# Patient Record
Sex: Female | Born: 1970 | Hispanic: Yes | Marital: Single
Health system: Southern US, Community
[De-identification: ages and names within clinical notes are randomized; demographics above are authoritative.]

## PROBLEM LIST (undated history)

## (undated) HISTORY — PX: BREAST CYST ASPIRATION: SHX578

---

## 2012-01-10 ENCOUNTER — Emergency Department: Payer: Self-pay | Admitting: Emergency Medicine

## 2012-01-10 LAB — CBC WITH DIFFERENTIAL/PLATELET
Basophil %: 1.5 %
Eosinophil #: 0.2 10*3/uL (ref 0.0–0.7)
Eosinophil %: 2.9 %
HCT: 37 % (ref 35.0–47.0)
HGB: 12.7 g/dL (ref 12.0–16.0)
Lymphocyte #: 2.9 10*3/uL (ref 1.0–3.6)
Lymphocyte %: 40 %
MCH: 30.9 pg (ref 26.0–34.0)
Monocyte #: 0.7 x10 3/mm (ref 0.2–0.9)
Neutrophil #: 3.3 10*3/uL (ref 1.4–6.5)
Neutrophil %: 45.2 %
RBC: 4.1 10*6/uL (ref 3.80–5.20)

## 2012-01-10 LAB — COMPREHENSIVE METABOLIC PANEL
Albumin: 3.9 g/dL (ref 3.4–5.0)
Alkaline Phosphatase: 62 U/L (ref 50–136)
Anion Gap: 7 (ref 7–16)
Bilirubin,Total: 0.3 mg/dL (ref 0.2–1.0)
Calcium, Total: 8.1 mg/dL — ABNORMAL LOW (ref 8.5–10.1)
Creatinine: 0.73 mg/dL (ref 0.60–1.30)
Osmolality: 276 (ref 275–301)
Potassium: 3.6 mmol/L (ref 3.5–5.1)
Sodium: 138 mmol/L (ref 136–145)
Total Protein: 7.3 g/dL (ref 6.4–8.2)

## 2012-01-10 LAB — CK TOTAL AND CKMB (NOT AT ARMC)
CK, Total: 88 U/L (ref 21–215)
CK-MB: 0.7 ng/mL (ref 0.5–3.6)

## 2012-01-10 LAB — TROPONIN I: Troponin-I: <0.02 ng/mL

## 2012-01-10 LAB — PREGNANCY, URINE: Pregnancy Test, Urine: NEGATIVE m[IU]/mL

## 2018-01-30 ENCOUNTER — Encounter: Payer: Self-pay | Admitting: *Deleted

## 2018-01-30 ENCOUNTER — Ambulatory Visit: Payer: Self-pay | Attending: Oncology | Admitting: *Deleted

## 2018-01-30 ENCOUNTER — Encounter (INDEPENDENT_AMBULATORY_CARE_PROVIDER_SITE_OTHER): Payer: Self-pay

## 2018-01-30 VITALS — BP 104/71 | HR 67 | Temp 98.1°F | Ht 62.75 in | Wt 141.0 lb

## 2018-01-30 DIAGNOSIS — N644 Mastodynia: Secondary | ICD-10-CM

## 2018-01-30 NOTE — Patient Instructions (Signed)
Prueba del VPH HPV Test La prueba del virus del papiloma humano (VPH) se usa para detectar los tipos de infeccin por el VPH de alto riesgo. El VPH es un grupo de alrededor de 100 virus. Muchos de estos virus causan tumores dentro de los genitales, sobre ellos o a su alrededor. La mayora de los VPH provocan infecciones que suelen desaparecen sin tratamiento. Sin embargo, los tipos 6, 11, 16 y 18 del VPH se consideran de alto riesgo y pueden aumentar el riesgo de padecer cncer de cuello del tero o de ano si la infeccin no se trata. La prueba del VPH identifica las cadenas de ADN (genticas) de la infeccin por el VPH, por lo que tambin se denomina prueba de ADN para el VPH. Aunque el VPH se encuentra tanto en los hombres como en las mujeres, la prueba del VPH se usa solo para detectar un mayor riesgo de cncer en las mujeres:  Con una prueba de Papanicolaou anormal.  Despus del tratamiento de una prueba de Papanicolaou anormal.  Entre 30y 65aos.  Despus del tratamiento de una infeccin por el VPH de alto riesgo.  La prueba del VPH se puede hacer al mismo tiempo que un examen plvico y una prueba de Papanicolaou en mujeres de ms de 30 aos. Tanto la prueba del VPH como la prueba de Papanicolaou requieren una muestra de clulas del cuello del tero. Cmo debo prepararme para esta prueba?  No se haga lavados vaginales ni se d un bao durante 24 a 48 horas antes de la prueba o como se lo haya indicado el mdico.  No tenga sexo durante 24 a 48 horas antes de la prueba o como se lo haya indicado el mdico.  Es posible que se le pida que reprograme la prueba si est menstruando.  Se le pedir que orine antes de la prueba. Qu significan los resultados? Es su responsabilidad retirar el resultado del estudio. Consulte en el laboratorio o en el departamento en el que fue realizado el estudio cundo y cmo podr obtener los resultados. Hable con el mdico si tiene alguna pregunta sobre los  resultados. El resultado ser negativo o positivo. Significado de los resultados negativos del anlisis Un resultado negativo de la prueba del VPH significa que no se detect el VPH, y es muy probable que no tenga el virus. Significado de los resultados positivos del anlisis Un resultado positivo de la prueba del VPH indica que tiene el virus.  Si el resultado de la prueba muestra la presencia de alguna cadena del VPH de alto riesgo, puede tener mayor riesgo de padecer cncer de cuello del tero o de ano si la infeccin no se trata.  Si se encuentran cadenas del VPH de bajo riesgo, es poco probable que tenga un alto riesgo de padecer cncer.  Hable con el mdico sobre los resultados. El mdico utilizar los resultados para realizar un diagnstico y determinar un plan de tratamiento adecuado para usted. Hable con el mdico sobre los resultados, las opciones de tratamiento y, si es necesario, la necesidad de realizar ms estudios. Hable con el mdico si tiene alguna pregunta sobre los resultados. Esta informacin no tiene como fin reemplazar el consejo del mdico. Asegrese de hacerle al mdico cualquier pregunta que tenga. Document Released: 05/20/2008 Document Revised: 04/22/2016 Document Reviewed: 06/19/2013 Elsevier Interactive Patient Education  2018 [employer]  Gave patient hand-out, Women Staying Healthy, Active and Well from [facility], with education on breast health, pap smears, heart and colon health.  

## 2018-01-30 NOTE — Progress Notes (Signed)
  Subjective:     Patient ID: Alexandria Gonzalez, female   DOB: 07/25/1970, 47 y.o.   MRN: [id number]  HPI   Review of Systems     Objective:   Physical Exam Chest:     Breasts:        Right: No swelling, bleeding, inverted nipple, mass, nipple discharge, skin change or tenderness.        Left: Tenderness present. No bleeding, inverted nipple, mass, nipple discharge or skin change.    Abdominal:     Palpations: There is no hepatomegaly or splenomegaly.  Genitourinary:    Labia:        Right: No rash, tenderness, lesion or injury.        Left: No rash, tenderness, lesion or injury.      Cervix: No cervical motion tenderness, discharge, friability, lesion or erythema.     Uterus: Not deviated, not enlarged, not fixed and not tender.      Adnexa:        Right: No mass, tenderness or fullness.         Left: No mass, tenderness or fullness.             Assessment:     47 year old Hispanic [facility] pap and [facility]ogram.  Alexandria Gonzalez # [phone] used for interpretation during the exam.  Patient complains of left breast pain for the past 2 months.  States it's intermittent.  States wearing an underwire bra makes it worse.  There is no dominant mass, skin changes or nipple discharge.  The patient is very tender to palpation at 3:00 left breast 3 cm from the nipple.  Taught self breast awareness.  Specimen collected for pap smear.  Patient refused rectal exam today.  Patient has been screened for eligibility.  She does not have any insurance, Medicare or Medicaid.  She also meets financial eligibility.  Hand-out given on the Affordable Care Act. Risk Assessment    Risk Scores      01/30/2018   Last edited by: Alexandria Gonzalez   5-year risk: 0.7 %   Lifetime risk: 6.5 %            Plan:     Will order diagnostic mammogram and ultrasound for the targeted left breast pain.  Alexandria Gonzalez to schedule patient's appointment.  Specimen for pap  smear sent to the lab.

## 2018-02-01 ENCOUNTER — Other Ambulatory Visit: Payer: Self-pay | Admitting: *Deleted

## 2018-02-01 DIAGNOSIS — Z0000 Encounter for general adult medical examination without abnormal findings: Secondary | ICD-10-CM

## 2018-02-03 ENCOUNTER — Encounter: Payer: Self-pay | Admitting: Radiology

## 2018-02-03 ENCOUNTER — Ambulatory Visit
Admission: RE | Admit: 2018-02-03 | Discharge: 2018-02-03 | Disposition: A | Discharge status: Home / Self Care | Payer: Self-pay | Source: Ambulatory Visit | Attending: Oncology | Admitting: Oncology

## 2018-02-03 DIAGNOSIS — N644 Mastodynia: Secondary | ICD-10-CM | POA: Insufficient documentation

## 2018-02-14 LAB — PAP LB AND HPV HIGH-RISK: HPV, high-risk: POSITIVE — AB

## 2018-03-16 ENCOUNTER — Encounter: Payer: Self-pay | Admitting: *Deleted

## 2018-03-16 NOTE — Progress Notes (Signed)
Letter mailed to inform patient of her normal mammogram and pap smear.  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  Next mammogram and pap due in one year.  Appointment scheduled for 01/31/19 @ 10:00.  [facility] to [place].

## 2018-10-05 ENCOUNTER — Other Ambulatory Visit: Payer: Self-pay

## 2018-10-05 DIAGNOSIS — Z20822 Contact with and (suspected) exposure to covid-19: Secondary | ICD-10-CM

## 2018-10-06 LAB — NOVEL CORONAVIRUS, NAA: SARS-CoV-2, NAA: NOT DETECTED

## 2019-01-30 ENCOUNTER — Encounter: Payer: Self-pay | Admitting: *Deleted

## 2019-01-30 NOTE — Progress Notes (Signed)
Tried to call patient via Alexandria Gonzalez the interpreter, to prescreen for her [place] appointment.  Left message for her to return my call.

## 2019-01-31 ENCOUNTER — Other Ambulatory Visit: Payer: Self-pay

## 2019-01-31 ENCOUNTER — Ambulatory Visit
Admission: RE | Admit: 2019-01-31 | Discharge: 2019-01-31 | Disposition: A | Discharge status: Home / Self Care | Payer: Self-pay | Source: Ambulatory Visit | Attending: Oncology | Admitting: Oncology

## 2019-01-31 ENCOUNTER — Ambulatory Visit: Payer: Self-pay | Attending: Oncology | Admitting: *Deleted

## 2019-01-31 ENCOUNTER — Encounter: Payer: Self-pay | Admitting: *Deleted

## 2019-01-31 DIAGNOSIS — Z0000 Encounter for general adult medical examination without abnormal findings: Secondary | ICD-10-CM

## 2019-01-31 NOTE — Progress Notes (Signed)
  Subjective:     Patient ID: Alexandria Gonzalez, female   DOB: 03-Jul-1970, 48 y.o.   MRN: [id number]  HPI   Review of Systems     Objective:   Physical Exam     Assessment:     48 year old Hispanic female presented directly to the [facility] today for her screening mammogram. See Al Pimple, RN notes.      Plan:     Will follow up per [facility] protocol.  Will schedule patient's pap for a later date due to Covid 19 pandemic.

## 2019-01-31 NOTE — Progress Notes (Signed)
Prescreened patient for [facility] eligibility using two patient identifiers due to COVID 19 precautions.  Patient to be rescheduled for 1 year follow-up pap. Her pap results from 01/30/18 were negative/HPV positive. Patient to arrive at [facility] at 10:00 for annual screening mammogram.  Risk Assessment    Risk Scores      01/31/2019 01/30/2018   Last edited by: Theodore Demark, RN [place], Laddie Aquas, CMA   5-year risk: 0.7 % 0.7 %   Lifetime risk: 6.4 % 6.5 %

## 2019-02-12 ENCOUNTER — Encounter: Payer: Self-pay | Admitting: *Deleted

## 2019-02-12 NOTE — Progress Notes (Signed)
Letter mailed from the [facility] to inform patient of her normal mammogram results.  Patient is to follow-up with annual screening in one year. 

## 2019-04-09 ENCOUNTER — Ambulatory Visit: Payer: Self-pay | Attending: Internal Medicine

## 2019-04-09 DIAGNOSIS — Z23 Encounter for immunization: Secondary | ICD-10-CM | POA: Insufficient documentation

## 2019-04-09 NOTE — Progress Notes (Signed)
   Covid-19 Vaccination Clinic  Name:  Alexandria Gonzalez    MRN: [id number] DOB: 07/14/1970  04/09/2019  Alexandria Gonzalez was observed post Covid-19 immunization for 15 minutes without incidence. She was provided with Vaccine Information Sheet and instruction to access the V-Safe system.   Alexandria Gonzalez was instructed to call 911 with any severe reactions post vaccine: Marland Kitchen Difficulty breathing  . Swelling of your face and throat  . A fast heartbeat  . A bad rash all over your body  . Dizziness and weakness    Immunizations Administered    Name Date Dose VIS Date Route   Moderna COVID-19 Vaccine 04/09/2019 10:00 AM 0.5 mL 01/16/2019 Intramuscular   Manufacturer: Moderna   Lot: [id number]   NDC: 70017-494-49

## 2019-05-08 ENCOUNTER — Ambulatory Visit: Payer: Self-pay | Attending: Internal Medicine

## 2019-05-08 DIAGNOSIS — Z23 Encounter for immunization: Secondary | ICD-10-CM

## 2019-05-08 NOTE — Progress Notes (Signed)
   Covid-19 Vaccination Clinic  Name:  Courtne Lighty    MRN: [id number] DOB: 01/24/1971  05/08/2019  Ms. Montalvo Sondra Come was observed post Covid-19 immunization for 30 minutes based on pre-vaccination screening without incident. She was provided with Vaccine Information Sheet and instruction to access the V-Safe system.   Ms. Ayslin Kundert was instructed to call 911 with any severe reactions post vaccine: Marland Kitchen Difficulty breathing  . Swelling of face and throat  . A fast heartbeat  . A bad rash all over body  . Dizziness and weakness   Immunizations Administered    Name Date Dose VIS Date Route   Moderna COVID-19 Vaccine 05/08/2019  9:51 AM 0.5 mL 01/16/2019 Intramuscular   Manufacturer: Gala Murdoch   Lot: [id number]   NDC: 52841-324-40

## 2019-12-31 ENCOUNTER — Ambulatory Visit: Payer: Self-pay | Attending: Internal Medicine

## 2019-12-31 DIAGNOSIS — Z23 Encounter for immunization: Secondary | ICD-10-CM

## 2019-12-31 NOTE — Progress Notes (Signed)
   Covid-19 Vaccination Clinic  Name:  Alexandria Gonzalez    MRN: [id number] DOB: 05-07-1970  12/31/2019  Alexandria Gonzalez was observed post Covid-19 immunization for 15 minutes without incident. She was provided with Vaccine Information Sheet and instruction to access the V-Safe system.   Alexandria Gonzalez was instructed to call 911 with any severe reactions post vaccine: Marland Kitchen Difficulty breathing  . Swelling of face and throat  . A fast heartbeat  . A bad rash all over body  . Dizziness and weakness   Immunizations Administered    Name Date Dose VIS Date Route   Moderna COVID-19 Vaccine 12/31/2019  1:50 PM 0.5 mL 12/05/2019 Intramuscular   Manufacturer: Moderna   Lot: [id number]   NDC: 18299-371-69

## 2021-12-07 IMAGING — MG DIGITAL SCREENING BILAT W/ TOMO W/ CAD
8 series · 9 of 24 positions shown · non-contrast
Comparison: Previous exam(s).

CLINICAL DATA: Screening.

EXAM:
DIGITAL SCREENING BILATERAL MAMMOGRAM WITH TOMO AND CAD

[L CC synth-2D]
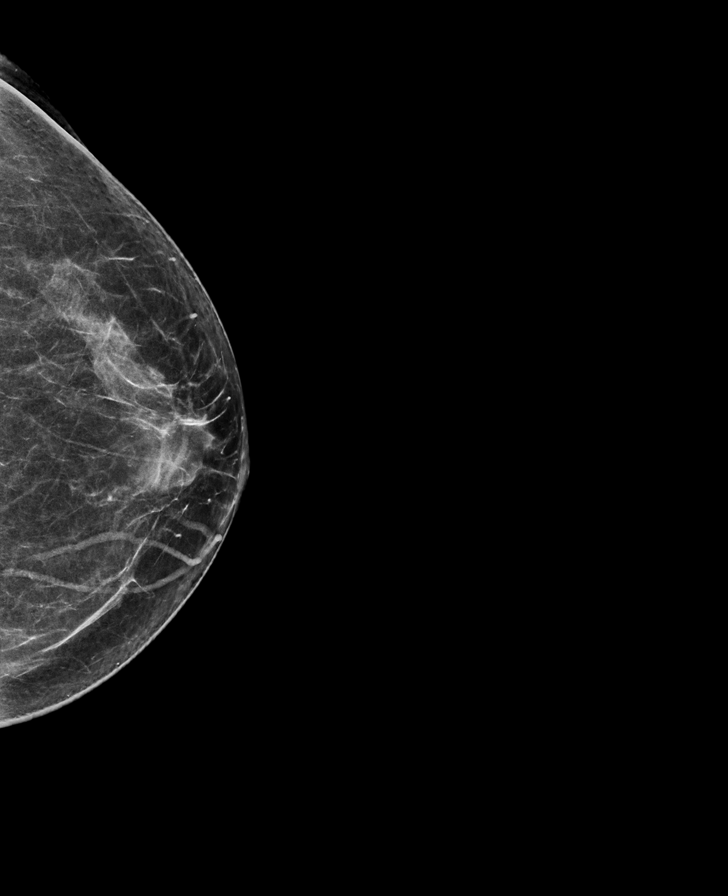

[L MLO synth-2D]
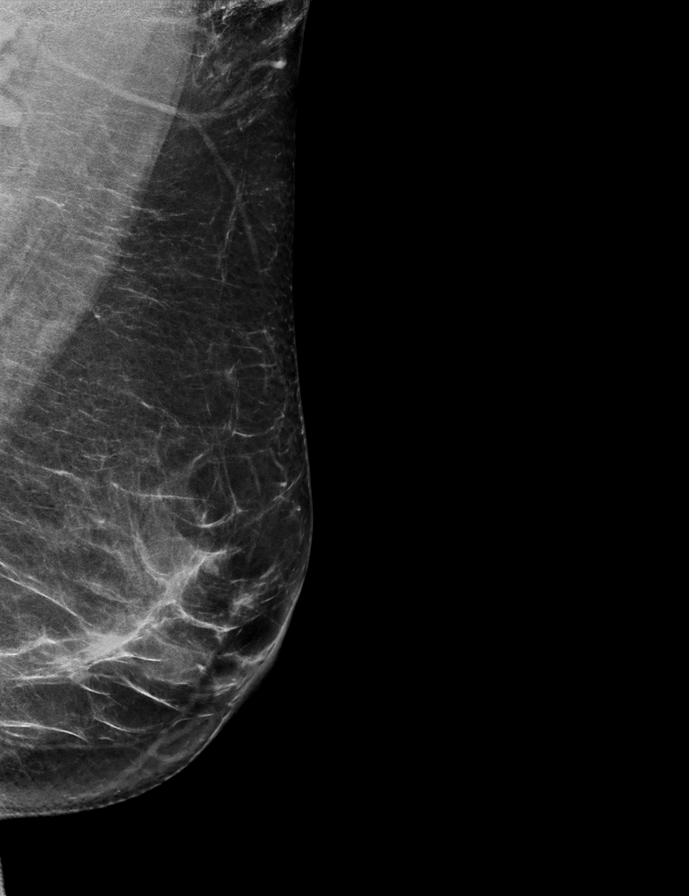

[R MLO synth-2D]
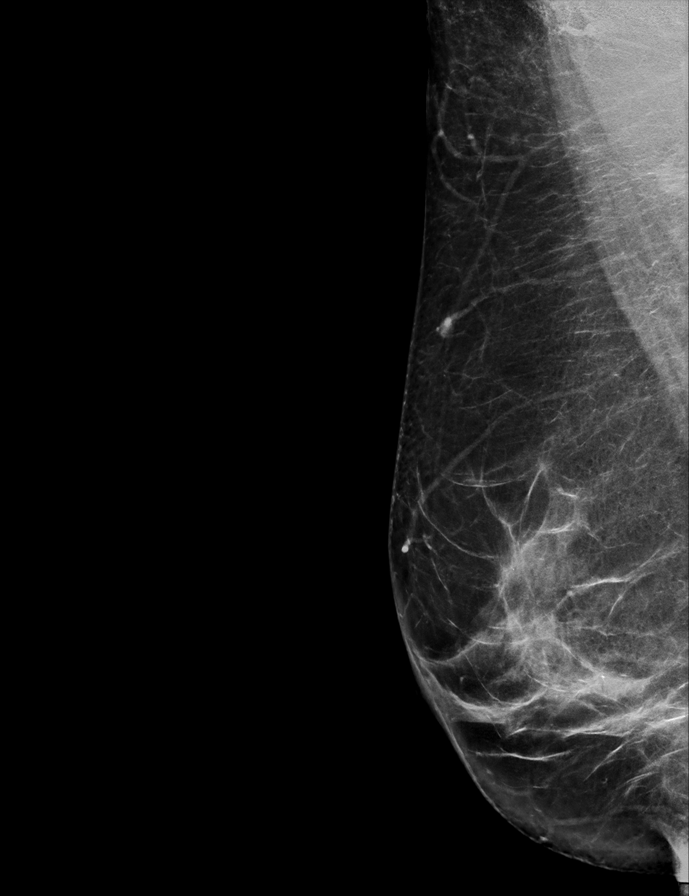

[R CC synth-2D]
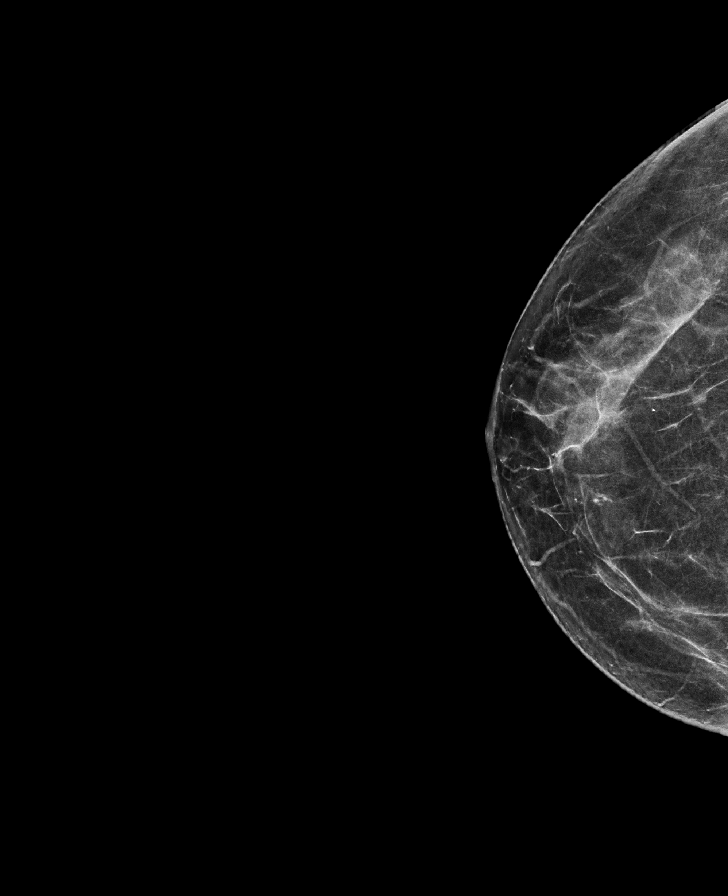

[R MLO tomo · 2 of 76 frames shown]
[frame 25/76]
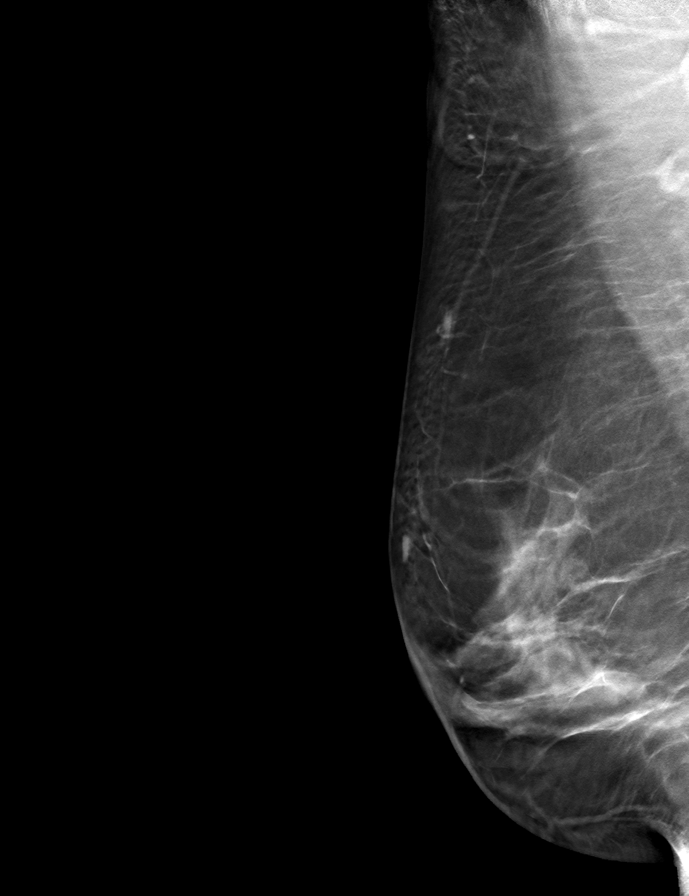
[frame 39/76]
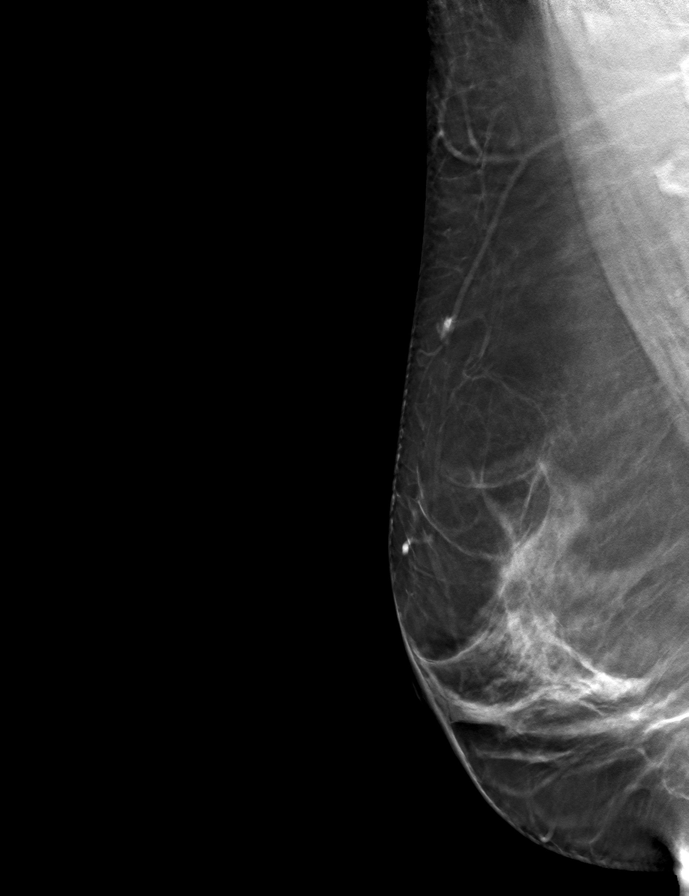

[R CC tomo · tomo slice 33/64.0]
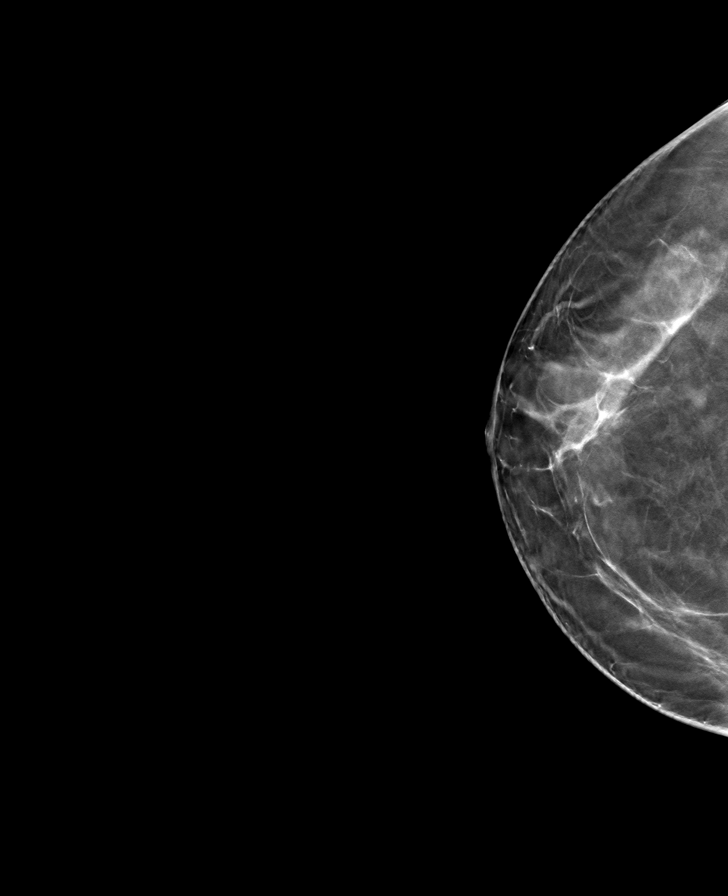

[L MLO tomo · tomo slice 36/71.0]
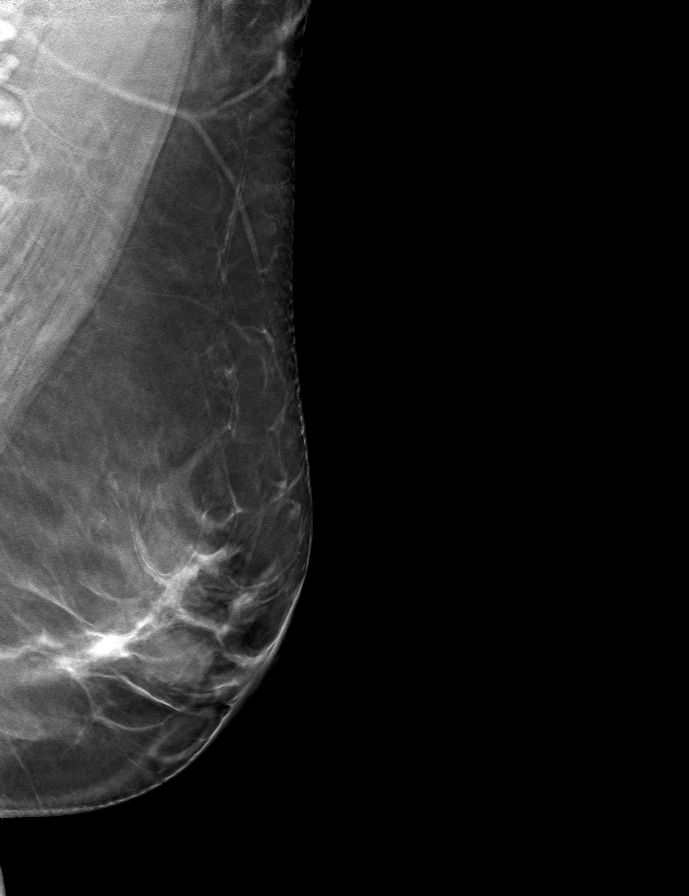

[L CC tomo · tomo slice 33/65.0]
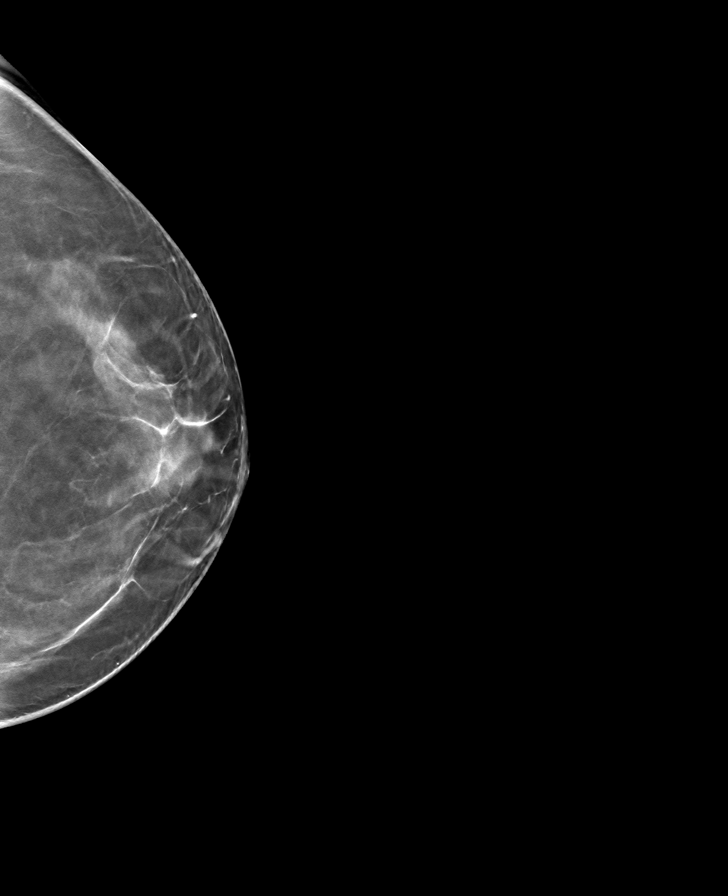

[9 of 24 positions shown; findings below may reference images not displayed]

ACR Breast Density Category b: There are scattered areas of
fibroglandular density.
FINDINGS: There are no findings suspicious for malignancy. Images were
processed with CAD.
IMPRESSION: No mammographic evidence of malignancy. A result letter of this
screening mammogram will be mailed directly to the patient.

RECOMMENDATION:
Screening mammogram in one year. (Code:[id number])

BI-RADS CATEGORY  1: Negative.
